# Patient Record
Sex: Female | Born: 1985 | Race: Black or African American | Hispanic: No | Marital: Single | Smoking: Never smoker
Health system: Southern US, Community
[De-identification: ages and names within clinical notes are randomized; demographics above are authoritative.]

## PROBLEM LIST (undated history)

## (undated) DIAGNOSIS — R1900 Intra-abdominal and pelvic swelling, mass and lump, unspecified site: Secondary | ICD-10-CM

## (undated) HISTORY — PX: WISDOM TOOTH EXTRACTION: SHX21

## (undated) HISTORY — DX: Intra-abdominal and pelvic swelling, mass and lump, unspecified site: R19.00

---

## 2006-04-09 ENCOUNTER — Ambulatory Visit: Payer: Self-pay | Admitting: Vascular Surgery

## 2009-03-23 ENCOUNTER — Encounter: Admission: RE | Admit: 2009-03-23 | Discharge: 2009-03-23 | Payer: Self-pay | Admitting: Ophthalmology

## 2009-05-16 ENCOUNTER — Encounter: Admission: RE | Admit: 2009-05-16 | Discharge: 2009-05-16 | Payer: Self-pay | Admitting: Diagnostic Neuroimaging

## 2009-07-01 ENCOUNTER — Encounter: Admission: RE | Admit: 2009-07-01 | Discharge: 2009-07-01 | Payer: Self-pay | Admitting: Diagnostic Neuroimaging

## 2009-09-13 ENCOUNTER — Encounter: Admission: RE | Admit: 2009-09-13 | Discharge: 2009-09-13 | Payer: Self-pay | Admitting: Diagnostic Neuroimaging

## 2009-10-25 ENCOUNTER — Encounter
Admission: RE | Admit: 2009-10-25 | Discharge: 2009-10-25 | Payer: Self-pay | Source: Home / Self Care | Attending: Diagnostic Neuroimaging | Admitting: Diagnostic Neuroimaging

## 2013-10-23 ENCOUNTER — Other Ambulatory Visit: Payer: Self-pay | Admitting: Family Medicine

## 2013-10-23 DIAGNOSIS — R1900 Intra-abdominal and pelvic swelling, mass and lump, unspecified site: Secondary | ICD-10-CM

## 2013-10-30 ENCOUNTER — Ambulatory Visit
Admission: RE | Admit: 2013-10-30 | Discharge: 2013-10-30 | Disposition: A | Discharge status: Home / Self Care | Payer: No Typology Code available for payment source | Source: Ambulatory Visit | Attending: Family Medicine | Admitting: Family Medicine

## 2013-10-30 DIAGNOSIS — R1900 Intra-abdominal and pelvic swelling, mass and lump, unspecified site: Secondary | ICD-10-CM

## 2013-11-19 ENCOUNTER — Other Ambulatory Visit: Payer: Self-pay | Admitting: *Deleted

## 2013-11-19 ENCOUNTER — Telehealth: Payer: Self-pay | Admitting: *Deleted

## 2013-11-19 DIAGNOSIS — R1900 Intra-abdominal and pelvic swelling, mass and lump, unspecified site: Secondary | ICD-10-CM

## 2013-11-19 NOTE — Telephone Encounter (Signed)
Attempted to contact patient on home phone number, no answer, left message for patient to call and request Orvella Digiulio/Kelly,. Contacted mother and informed her we need to speak with Kayla Petersen today, can she get a message to her today to call the clinic and request Orel Cooler/Kelly. Mother verbalizes understanding and will attempt to get a message to her today.

## 2013-11-19 NOTE — Telephone Encounter (Signed)
Contacted patient to discuss referral and application for charity care. Pt desires for us to contact her mother, Clifton JamesDoris Human to discuss the issue.  She gives permission for us to speak with her mother. Informed patient of referral and need for additional testing, MRI. Informed of cost of test and need to apply for charity care if she has no insurance. Informed of the documentation that we would need to process application and patient needs to come to the clinic to see Tresa EndoKelly, so Tresa EndoKelly can rush application.  Contacted patient's mother and discussed the above.  Pt mother will come today and pick up the application.

## 2013-12-03 ENCOUNTER — Ambulatory Visit (HOSPITAL_COMMUNITY)
Admission: RE | Admit: 2013-12-03 | Discharge: 2013-12-03 | Disposition: A | Discharge status: Home / Self Care | Payer: Self-pay | Source: Ambulatory Visit | Attending: Obstetrics & Gynecology | Admitting: Obstetrics & Gynecology

## 2013-12-03 ENCOUNTER — Encounter (HOSPITAL_COMMUNITY): Payer: Self-pay

## 2013-12-03 DIAGNOSIS — R102 Pelvic and perineal pain: Secondary | ICD-10-CM | POA: Insufficient documentation

## 2013-12-03 DIAGNOSIS — K6389 Other specified diseases of intestine: Secondary | ICD-10-CM | POA: Insufficient documentation

## 2013-12-03 DIAGNOSIS — R1900 Intra-abdominal and pelvic swelling, mass and lump, unspecified site: Secondary | ICD-10-CM

## 2013-12-03 DIAGNOSIS — N8320 Unspecified ovarian cysts: Secondary | ICD-10-CM | POA: Insufficient documentation

## 2013-12-03 MED ORDER — GADOBENATE DIMEGLUMINE 529 MG/ML IV SOLN
12.0000 mL | Freq: Once | INTRAVENOUS | Status: AC | PRN
  Administered 2013-12-03: 12 mL via INTRAVENOUS

## 2013-12-07 ENCOUNTER — Telehealth: Payer: Self-pay | Admitting: General Practice

## 2013-12-07 NOTE — Telephone Encounter (Signed)
Called patient, no answer- left message that we are calling with some non urgent results, please call us back at the clinics. Patient needs to be informed of ultrasound results and that [facility] refer her to a GI dr not GYN.

## 2013-12-07 NOTE — Telephone Encounter (Signed)
-----   Message from Tereso NewcomerUgonna A Anyanwu, MD sent at 12/07/2013  7:48 AM EST ----- Pelvic mass is distended large bowel and rectum; GYN organs are okay (2 cm cyst is physiologic).  Needs GI referral not GYN.  Please call to inform patient of results and recommendations.

## 2013-12-08 NOTE — Telephone Encounter (Signed)
Called patient, no answer- left message stating we are trying to reach you with non urgent results, please call us back at the clinics and let us know if information can be left on your voicemail.

## 2013-12-10 NOTE — Telephone Encounter (Signed)
Called patient and informed of recommendations, pt verbalizes understanding.  Pt to inquire about if [facility] provider can see results and notify us if we need to send records.

## 2014-01-13 ENCOUNTER — Encounter: Payer: Self-pay | Admitting: Obstetrics & Gynecology

## 2014-01-13 ENCOUNTER — Ambulatory Visit (INDEPENDENT_AMBULATORY_CARE_PROVIDER_SITE_OTHER): Payer: Self-pay | Admitting: Obstetrics & Gynecology

## 2014-01-13 VITALS — BP 131/77 | HR 91 | Temp 97.5°F | Resp 20 | Ht 70.0 in | Wt 147.7 lb

## 2014-01-13 DIAGNOSIS — R197 Diarrhea, unspecified: Secondary | ICD-10-CM

## 2014-01-13 DIAGNOSIS — R1900 Intra-abdominal and pelvic swelling, mass and lump, unspecified site: Secondary | ICD-10-CM

## 2014-01-13 NOTE — Progress Notes (Signed)
Discussed w/pt that her recent MRI shows that she does not have a pelvic mass. Per chart review, pt was contacted about this on 12/10/13 by our office personnel Blanchard Kelchndace Haizlip, RN.  Pt is confused and states that she has been given conflicting information. She reports that Dr. Read DriversMolpus told her that she was sure she had a pelvic mass. I asked if that was before the MRI and pt stated, "Yes".  She apparently had appt w/[facility] but cancelled her follow up appt on 1/11 because "they didn't follow up the way they should have."  Pt desires to speak w/Dr. Marice Potterove and her mother os present also.

## 2014-01-27 ENCOUNTER — Encounter: Payer: Self-pay | Admitting: Obstetrics & Gynecology

## 2014-01-27 NOTE — Progress Notes (Signed)
   Subjective:    Patient ID: Kayla SkinnerYolanda F Kyllonen, female    DOB: April 09, 1985, 29 y.o.   MRN: [id number]  HPI  She is here for follow up a pelvic mass. A follow up MRI showed that her distal colon/rectosigmoid is full of stool.  Review of Systems     Objective:   Physical Exam        Assessment & Plan:  She has an appt with a GI for that issue

## 2015-09-01 IMAGING — US US PELVIS COMPLETE
1 series · 13 of 25 positions shown · non-contrast
Comparison: None

CLINICAL DATA: Palpable pelvic mass



[Series 1: us pelvis complete · 0.40mm/px · 13 of 28 slices shown]
[im 1/28]
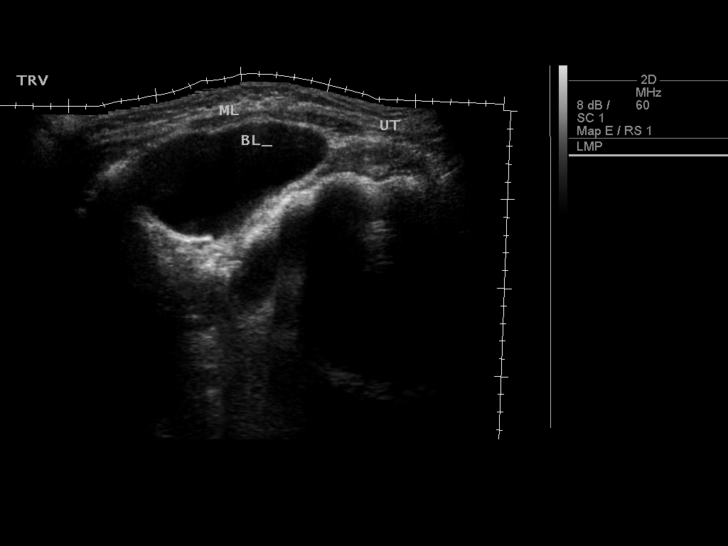
[im 3/28]
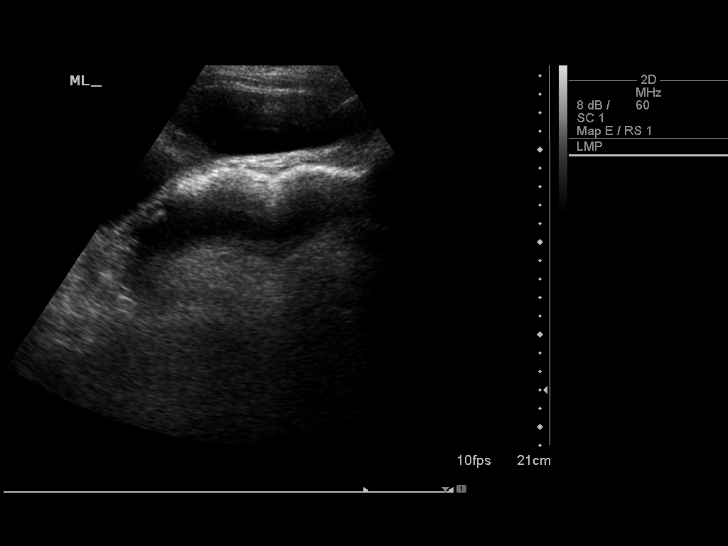
[im 5/28]
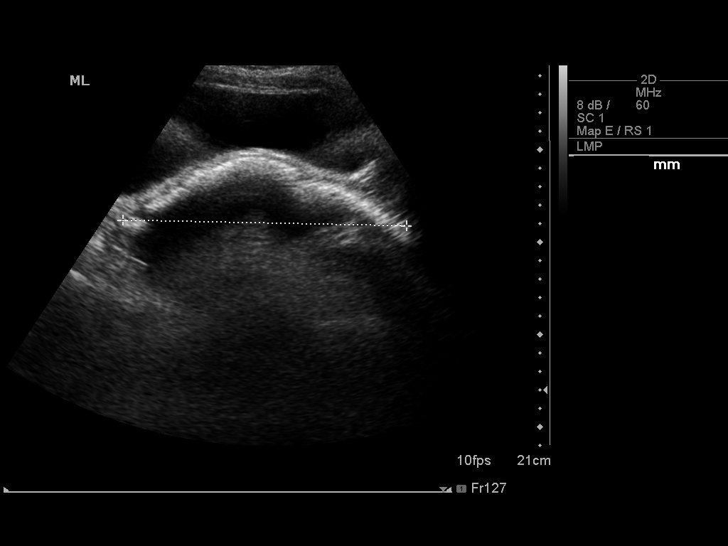
[im 7/28]
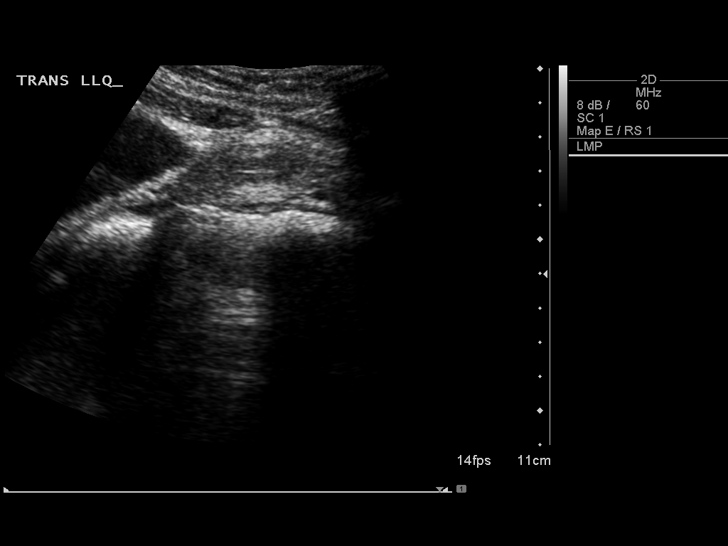
[im 10/28]
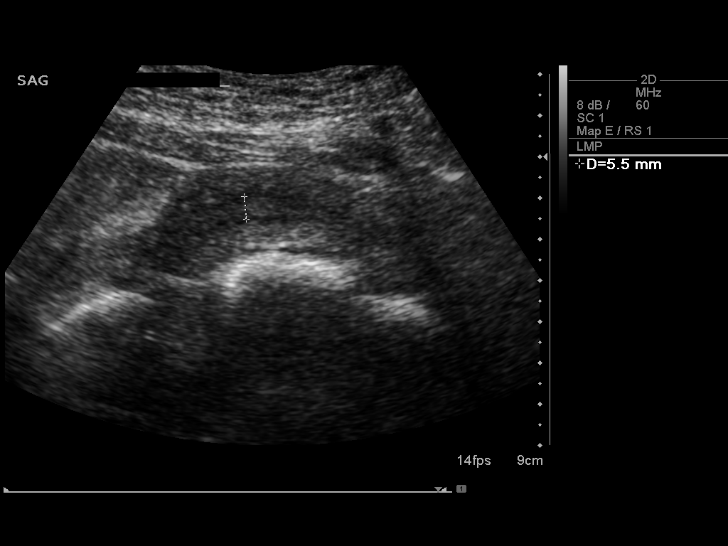
[im 12/28]
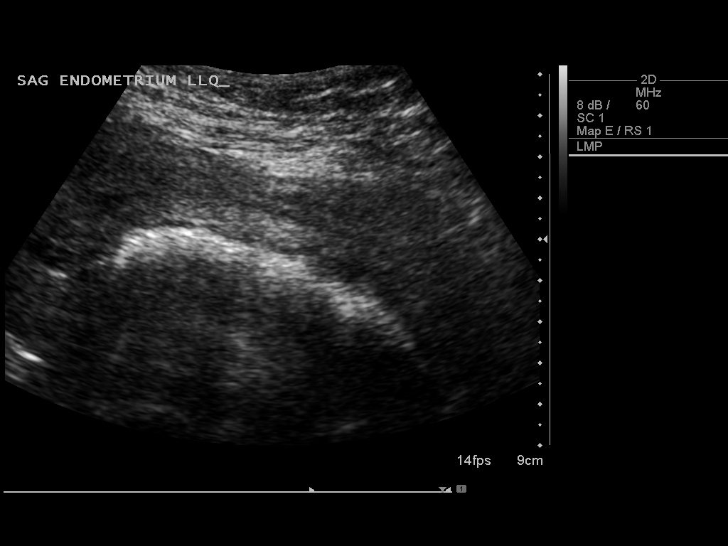
[im 14/28]
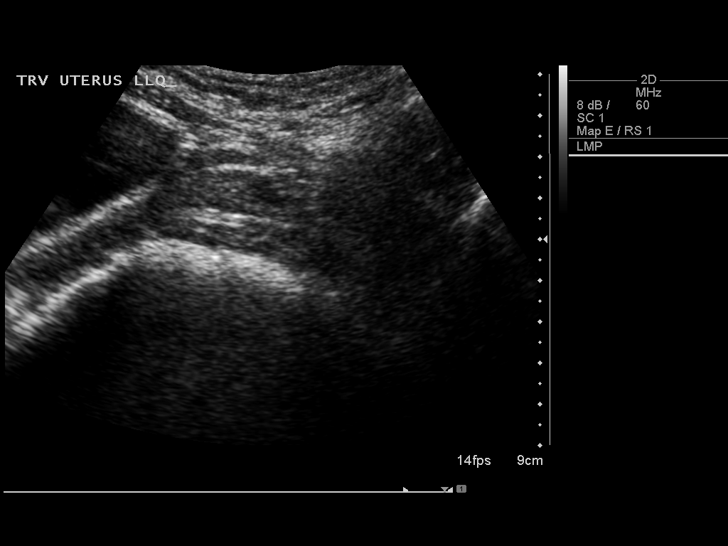
[im 16/28]
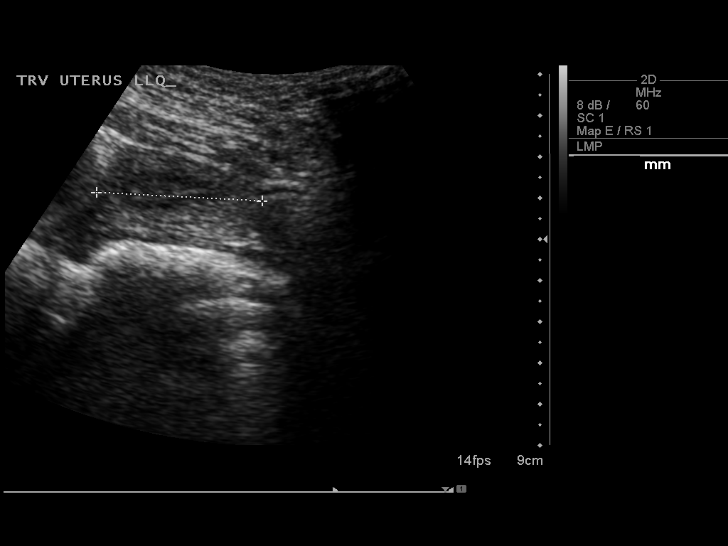
[im 19/28]
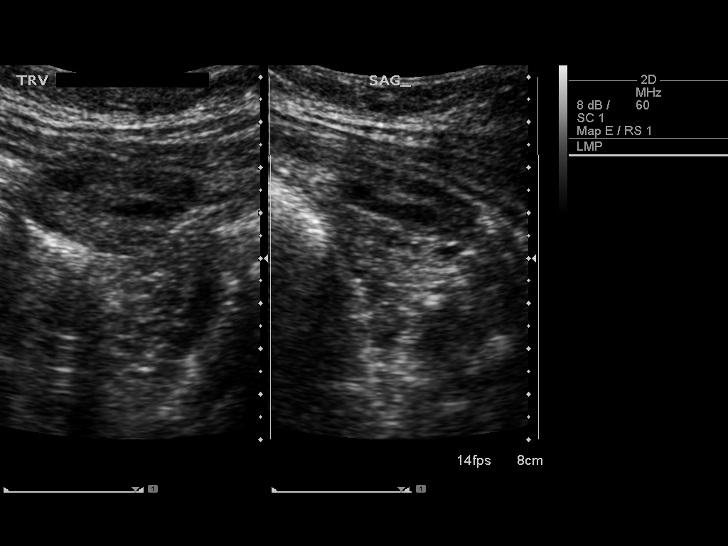
[im 21/28]
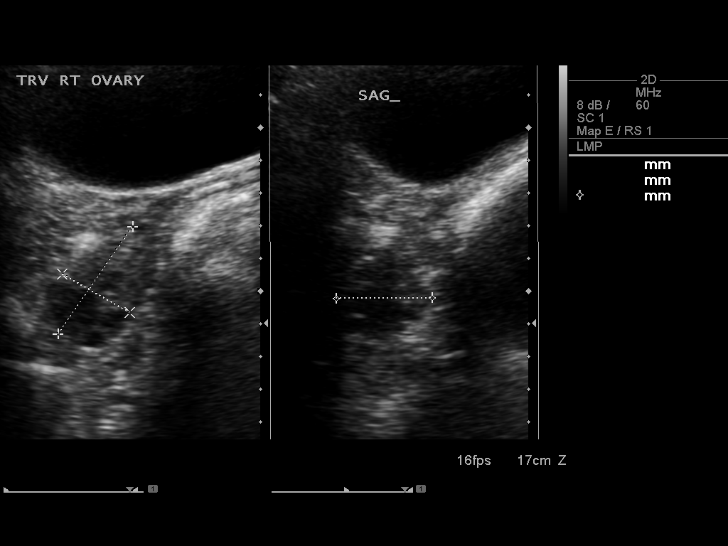
[im 23/28]
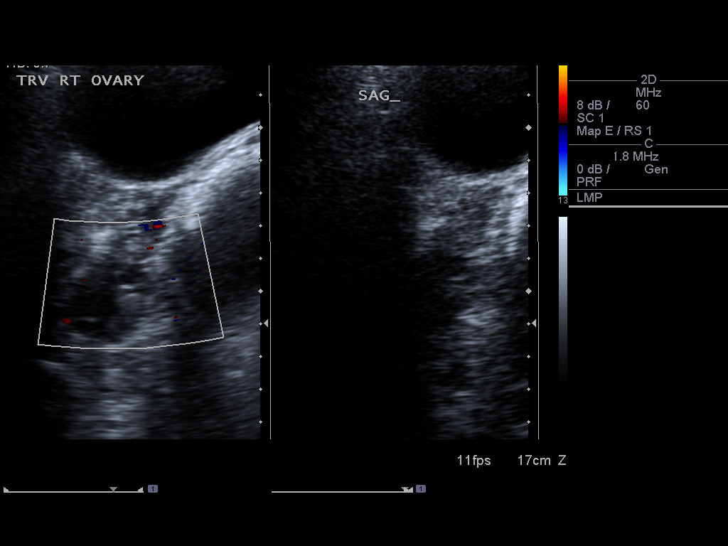
[im 25/28]
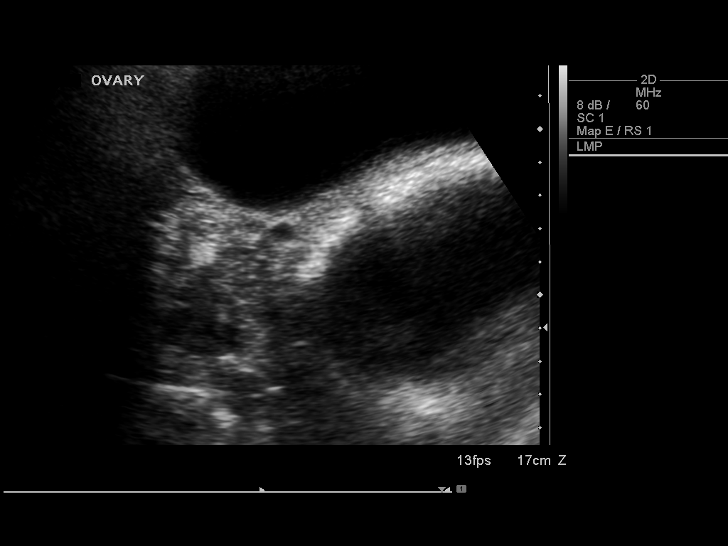
[im 28/28]
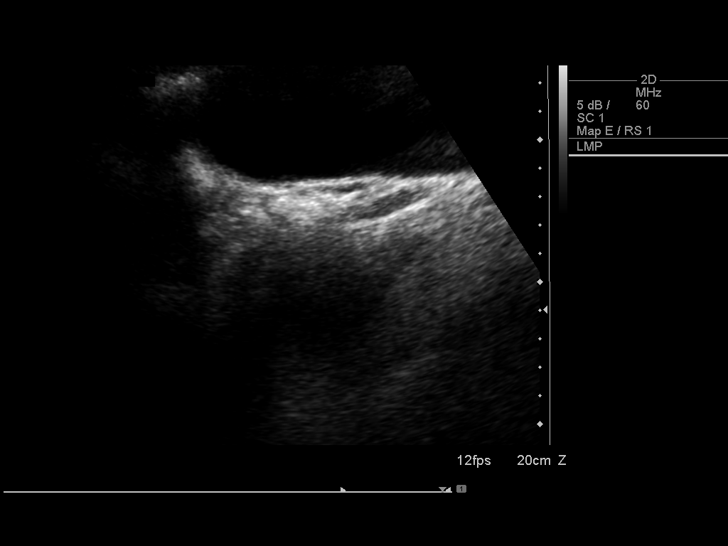

[13 of 25 positions shown; findings below may reference images not displayed]

FINDINGS: Uterus

Measurements: 12.7 x 3.1 x 4.0 cm.. No fibroids or other mass
visualized.

Endometrium

Thickness: 5.5 mm..  No focal abnormality visualized.

Right ovary

Measurements: 4.0 x 2.4 x 2.9 cm.. Normal appearance/no adnexal
mass.

Left ovary

Measurements: 4.0 x 2.2 x 3.6 cm.. Normal appearance/no adnexal
mass.

Other findings

No free fluid is noted. There is a large peripherally
echogenic/calcified mass lesion arising in the pelvis which
displaces the uterus, ovaries and urinary bladder. This does not
appear to arise from the uterus but could represent a large
exophytic fibroid.
IMPRESSION: Large peripherally echogenic mass lesion arising from the pelvis.
Its etiology is uncertain and MRI is recommended for further
characterization.

These results will be called to the ordering clinician or
representative by the Radiologist Assistant, and communication
documented in the PACS or zVision Dashboard.

## 2021-06-25 ENCOUNTER — Other Ambulatory Visit: Payer: Self-pay

## 2021-06-25 ENCOUNTER — Emergency Department (HOSPITAL_COMMUNITY)
Admission: EM | Admit: 2021-06-25 | Discharge: 2021-06-25 | Disposition: A | Discharge status: Home / Self Care | Payer: BC Managed Care – PPO | Attending: Emergency Medicine | Admitting: Emergency Medicine

## 2021-06-25 ENCOUNTER — Encounter (HOSPITAL_COMMUNITY): Payer: Self-pay | Admitting: Pharmacy Technician

## 2021-06-25 DIAGNOSIS — E876 Hypokalemia: Secondary | ICD-10-CM | POA: Insufficient documentation

## 2021-06-25 DIAGNOSIS — R110 Nausea: Secondary | ICD-10-CM | POA: Insufficient documentation

## 2021-06-25 DIAGNOSIS — K5900 Constipation, unspecified: Secondary | ICD-10-CM | POA: Insufficient documentation

## 2021-06-25 LAB — COMPREHENSIVE METABOLIC PANEL
ALT: 13 U/L (ref 0–44)
AST: 21 U/L (ref 15–41)
Albumin: 4.3 g/dL (ref 3.5–5.0)
Alkaline Phosphatase: 31 U/L — ABNORMAL LOW (ref 38–126)
Anion gap: 11 (ref 5–15)
BUN: 11 mg/dL (ref 6–20)
CO2: 24 mmol/L (ref 22–32)
Calcium: 9.9 mg/dL (ref 8.9–10.3)
Chloride: 105 mmol/L (ref 98–111)
Creatinine, Ser: 0.81 mg/dL (ref 0.44–1.00)
GFR, Estimated: >60 mL/min (ref >60)
Glucose, Bld: 111 mg/dL — ABNORMAL HIGH (ref 70–99)
Potassium: 3.2 mmol/L — ABNORMAL LOW (ref 3.5–5.1)
Sodium: 140 mmol/L (ref 135–145)
Total Bilirubin: 0.8 mg/dL (ref 0.3–1.2)
Total Protein: 7.6 g/dL (ref 6.5–8.1)

## 2021-06-25 LAB — I-STAT BETA HCG BLOOD, ED (MC, WL, AP ONLY): I-stat hCG, quantitative: <5 m[IU]/mL (ref <5)

## 2021-06-25 LAB — LIPASE, BLOOD: Lipase: 40 U/L (ref 11–51)

## 2021-06-25 LAB — CBC
HCT: 38.9 % (ref 36.0–46.0)
Hemoglobin: 13.1 g/dL (ref 12.0–15.0)
MCH: 27.9 pg (ref 26.0–34.0)
MCHC: 33.7 g/dL (ref 30.0–36.0)
MCV: 82.9 fL (ref 80.0–100.0)
Platelets: 311 10*3/uL (ref 150–400)
RBC: 4.69 MIL/uL (ref 3.87–5.11)
RDW: 14.6 % (ref 11.5–15.5)
WBC: 5.5 10*3/uL (ref 4.0–10.5)
nRBC: 0 % (ref 0.0–0.2)

## 2021-06-25 MED ORDER — BISACODYL 5 MG PO TBEC: 10.0000 mg | DELAYED_RELEASE_TABLET | Freq: Every day | ORAL | 0 refills | Status: DC | PRN

## 2021-06-25 MED ORDER — ONDANSETRON 4 MG PO TBDP: 4.0000 mg | ORAL_TABLET | Freq: Three times a day (TID) | ORAL | 0 refills | Status: AC | PRN

## 2021-06-25 MED ORDER — POLYETHYLENE GLYCOL 3350 17 GM/SCOOP PO POWD: 1.0000 | Freq: Once | ORAL | 0 refills | Status: AC

## 2021-06-25 MED ORDER — GLYCERIN (ADULT) 2 G RE SUPP: 1.0000 | RECTAL | 0 refills | Status: AC | PRN

## 2021-06-25 NOTE — ED Triage Notes (Addendum)
Pt here with constipation for "a while". Hx of same. Endorses nausea and vomiting.

## 2022-01-26 ENCOUNTER — Ambulatory Visit
Admission: EM | Admit: 2022-01-26 | Discharge: 2022-01-26 | Disposition: A | Discharge status: Home / Self Care | Payer: Medicaid Other | Attending: Nurse Practitioner | Admitting: Nurse Practitioner

## 2022-01-26 ENCOUNTER — Ambulatory Visit (INDEPENDENT_AMBULATORY_CARE_PROVIDER_SITE_OTHER): Payer: BC Managed Care – PPO

## 2022-01-26 DIAGNOSIS — K5900 Constipation, unspecified: Secondary | ICD-10-CM

## 2022-01-26 DIAGNOSIS — K5909 Other constipation: Secondary | ICD-10-CM

## 2022-01-26 MED ORDER — POLYETHYLENE GLYCOL 3350 17 G PO PACK: 17.0000 g | PACK | Freq: Once | ORAL | 0 refills | Status: AC

## 2022-01-26 MED ORDER — BISACODYL 5 MG PO TBEC: 10.0000 mg | DELAYED_RELEASE_TABLET | Freq: Every day | ORAL | 0 refills | Status: DC | PRN

## 2022-01-26 NOTE — Discharge Instructions (Addendum)
Declines once daily as needed 1 dose of MiraLAX Increase your fiber intake and stay hydrated Please go to the emergency room if any of your symptoms worsen Follow-up with your PCP for further chronic constipation management options

## 2022-01-26 NOTE — ED Provider Notes (Signed)
[facility]    CSN: [id number] Arrival date & time: 01/26/22  1801      History   Chief Complaint Chief Complaint  Patient presents with   Constipation    HPI Kayla Petersen is a 37 y.o. female presents for evaluation of constipation.  Patient reports a history of chronic constipation.  She states over the past 2 days she has noticed a fullness which is typically her first sign that she is constipated.  She denies any abdominal pain, nausea/vomiting, bloating.  She is passing gas.  She does not remember when her last bowel movement was.  She does admit she does not eat a lot of fiber but is trying to increase that.  She was last seen in June 2023 for constipation.  She had an x-ray of her abdomen which showed moderate stool as well as a stool ball and was sent to the ER for possible disimpaction.  And seen in the ER she was prescribed laxatives and sent home and states that worked.  she does not take any medications over-the-counter to help prevent her constipation.  No other concerns at this time.   Constipation   Past Medical History:  Diagnosis Date   Pelvic mass     There are no problems to display for this patient.   Past Surgical History:  Procedure Laterality Date   WISDOM TOOTH EXTRACTION      OB History     Gravida  0   Para  0   Term  0   Preterm  0   AB  0   Living  0      SAB  0   IAB  0   Ectopic  0   Multiple  0   Live Births               Home Medications    Prior to Admission medications   Medication Sig Start Date End Date Taking? Authorizing Provider  bisacodyl (DULCOLAX) 5 MG EC tablet Take 2 tablets (10 mg total) by mouth daily as needed for moderate constipation. 01/26/22  Yes Melynda Ripple, NP  polyethylene glycol (MIRALAX / GLYCOLAX) 17 g packet Take 17 g by mouth once for 1 dose. 01/26/22 01/26/22 Yes Melynda Ripple, NP  glycerin adult 2 g suppository Place 1 suppository rectally as needed for constipation.  06/25/21   Henderly, Britni A, PA-C  ondansetron (ZOFRAN-ODT) 4 MG disintegrating tablet Take 1 tablet (4 mg total) by mouth every 8 (eight) hours as needed for nausea or vomiting. 06/25/21   Henderly, Britni A, PA-C    Family History Family History  Problem Relation Age of Onset   Hypertension Mother     Social History Social History   Tobacco Use   Smoking status: Never  Substance Use Topics   Alcohol use: No   Drug use: No     Allergies   Patient has no known allergies.   Review of Systems Review of Systems  Gastrointestinal:  Positive for constipation.     Physical Exam Triage Vital Signs ED Triage Vitals  Enc Vitals Group     BP 01/26/22 1819 127/87     Pulse Rate 01/26/22 1819 100     Resp 01/26/22 1819 16     Temp 01/26/22 1819 (!) 97.5 F (36.4 C)     Temp Source 01/26/22 1819 Oral     SpO2 01/26/22 1819 98 %     Weight --  Height --      Head Circumference --      Peak Flow --      Pain Score 01/26/22 1833 0     Pain Loc --      Pain Edu? --      Excl. in [place]? --    No data found.  Updated Vital Signs BP 127/87 (BP Location: Left Arm)   Pulse 100   Temp (!) 97.5 F (36.4 C) (Oral)   Resp 16   LMP 01/17/2022   SpO2 98%   Visual Acuity Right Eye Distance:   Left Eye Distance:   Bilateral Distance:    Right Eye Near:   Left Eye Near:    Bilateral Near:     Physical Exam Vitals and nursing note reviewed.  Constitutional:      Appearance: Normal appearance.  HENT:     Head: Normocephalic and atraumatic.  Eyes:     Pupils: Pupils are equal, round, and reactive to light.  Cardiovascular:     Rate and Rhythm: Normal rate.  Pulmonary:     Effort: Pulmonary effort is normal.  Abdominal:     General: Bowel sounds are normal.     Tenderness: There is no abdominal tenderness. There is no right CVA tenderness or left CVA tenderness.     Comments: Abdomen is nontender but firm  Skin:    General: Skin is warm and dry.  Neurological:      General: No focal deficit present.     Mental Status: She is alert and oriented to person, place, and time.  Psychiatric:        Mood and Affect: Mood normal.        Behavior: Behavior normal.      UC Treatments / Results  Labs (all labs ordered are listed, but only abnormal results are displayed) Labs Reviewed - No data to display  EKG   Radiology DG Abd 1 View  Result Date: 01/26/2022 CLINICAL DATA:  Constipation. EXAM: ABDOMEN - 1 VIEW COMPARISON:  None Available. FINDINGS: No abnormal bowel dilatation is noted. Large amount of stool seen throughout the colon rectum. No radio-opaque calculi or other significant radiographic abnormality are seen. IMPRESSION: Large stool burden.  No abnormal bowel dilatation. Electronically Signed   By: Marijo Conception M.D.   On: 01/26/2022 19:32    Procedures Procedures (including critical care time)  Medications Ordered in UC Medications - No data to display  Initial Impression / Assessment and Plan / UC Course  I have reviewed the triage vital signs and the nursing notes.  Pertinent labs & imaging results that were available during my care of the patient were reviewed by me and considered in my medical decision making (see chart for details).     Reviewed exam and x-ray with patient MiraLAX and declog sent to pharmacy.  This has worked for patient in the past Discussed fluids and increasing fiber Advised follow-up with PCP for further management of her chronic constipation ER precautions reviewed and patient verbalized understanding Final Clinical Impressions(s) / UC Diagnoses   Final diagnoses:  Chronic constipation     Discharge Instructions      Declines once daily as needed 1 dose of MiraLAX Increase your fiber intake and stay hydrated Please go to the emergency room if any of your symptoms worsen Follow-up with your PCP for further chronic constipation management options     ED Prescriptions     Medication  Sig Dispense Auth. Provider  bisacodyl (DULCOLAX) 5 MG EC tablet Take 2 tablets (10 mg total) by mouth daily as needed for moderate constipation. 5 tablet Radford Pax, NP   polyethylene glycol (MIRALAX / GLYCOLAX) 17 g packet Take 17 g by mouth once for 1 dose. 1 each Radford Pax, NP      PDMP not reviewed this encounter.   Radford Pax, NP 01/26/22 [phone]

## 2022-01-26 NOTE — ED Triage Notes (Signed)
Pt c/o constipation that began Wednesday.  Home interventions: none

## 2022-02-25 ENCOUNTER — Ambulatory Visit
Admission: EM | Admit: 2022-02-25 | Discharge: 2022-02-25 | Disposition: A | Discharge status: Home / Self Care | Payer: Medicaid Other | Attending: Nurse Practitioner | Admitting: Nurse Practitioner

## 2022-02-25 DIAGNOSIS — H01119 Allergic dermatitis of unspecified eye, unspecified eyelid: Secondary | ICD-10-CM

## 2022-02-25 MED ORDER — HYDROCORTISONE 1 % EX CREA: TOPICAL_CREAM | CUTANEOUS | 0 refills | Status: AC

## 2022-02-25 NOTE — Discharge Instructions (Signed)
Over-the-counter Aquaphor lotion to your eyelids for hydration Hydrocortisone cream sparingly no more than twice a day and no more than 5 days to your upper eyelids.  Avoid getting this into your eyes Start an allergy medication over-the-counter such as Claritin or Zyrtec daily for at least 7 days Follow-up if your symptoms or not improving Please go to the ER for any worsening symptoms

## 2022-02-25 NOTE — ED Provider Notes (Signed)
[facility]    CSN: [id number] Arrival date & time: 02/25/22  1527      History   Chief Complaint Chief Complaint  Patient presents with   Rash    HPI Kayla Petersen is a 37 y.o. female presents for evaluation of a rash.  Patient reports 2 weeks of a pruritic flaking dry rash on her upper eyelids bilaterally.  Denies any drainage, fevers, chills.  Denies any new contacts including soaps, medications, make-up, etc.  Denies history of eczema but that does run in her family.  She has not used any OTC medications.  No other concerns at this time.   Rash   Past Medical History:  Diagnosis Date   Pelvic mass     There are no problems to display for this patient.   Past Surgical History:  Procedure Laterality Date   WISDOM TOOTH EXTRACTION      OB History     Gravida  0   Para  0   Term  0   Preterm  0   AB  0   Living  0      SAB  0   IAB  0   Ectopic  0   Multiple  0   Live Births               Home Medications    Prior to Admission medications   Medication Sig Start Date End Date Taking? Authorizing Provider  hydrocortisone cream 1 % Apply to affected area 2 times daily.  Use sparingly and no more than 5 days.  Do not get into the eye. 02/25/22  Yes Melynda Ripple, NP  bisacodyl (DULCOLAX) 5 MG EC tablet Take 2 tablets (10 mg total) by mouth daily as needed for moderate constipation. 01/26/22   Melynda Ripple, NP  glycerin adult 2 g suppository Place 1 suppository rectally as needed for constipation. 06/25/21   Henderly, Britni A, PA-C  ondansetron (ZOFRAN-ODT) 4 MG disintegrating tablet Take 1 tablet (4 mg total) by mouth every 8 (eight) hours as needed for nausea or vomiting. 06/25/21   Henderly, Britni A, PA-C    Family History Family History  Problem Relation Age of Onset   Hypertension Mother     Social History Social History   Tobacco Use   Smoking status: Never  Substance Use Topics   Alcohol use: No   Drug use: No      Allergies   Patient has no known allergies.   Review of Systems Review of Systems  Skin:  Positive for rash.     Physical Exam Triage Vital Signs ED Triage Vitals  Enc Vitals Group     BP 02/25/22 1558 114/77     Pulse Rate 02/25/22 1558 73     Resp 02/25/22 1558 18     Temp 02/25/22 1558 98.6 F (37 C)     Temp Source 02/25/22 1558 Oral     SpO2 02/25/22 1558 98 %     Weight --      Height --      Head Circumference --      Peak Flow --      Pain Score 02/25/22 1557 0     Pain Loc --      Pain Edu? --      Excl. in [place]? --    No data found.  Updated Vital Signs BP 114/77 (BP Location: Left Arm)   Pulse 73   Temp  98.6 F (37 C) (Oral)   Resp 18   LMP 02/19/2022   SpO2 98%   Visual Acuity Right Eye Distance:   Left Eye Distance:   Bilateral Distance:    Right Eye Near:   Left Eye Near:    Bilateral Near:     Physical Exam Vitals and nursing note reviewed.  Constitutional:      Appearance: Normal appearance.  HENT:     Head: Normocephalic and atraumatic.  Eyes:     Pupils: Pupils are equal, round, and reactive to light.     Comments: Dry nonerythematous rash to upper eyelids bilaterally.  No drainage, swelling, warmth.  Cardiovascular:     Rate and Rhythm: Normal rate.  Pulmonary:     Effort: Pulmonary effort is normal.  Skin:    General: Skin is warm and dry.  Neurological:     General: No focal deficit present.     Mental Status: She is alert and oriented to person, place, and time.  Psychiatric:        Mood and Affect: Mood normal.        Behavior: Behavior normal.      UC Treatments / Results  Labs (all labs ordered are listed, but only abnormal results are displayed) Labs Reviewed - No data to display  EKG   Radiology No results found.  Procedures Procedures (including critical care time)  Medications Ordered in UC Medications - No data to display  Initial Impression / Assessment and Plan / UC Course  I have  reviewed the triage vital signs and the nursing notes.  Pertinent labs & imaging results that were available during my care of the patient were reviewed by me and considered in my medical decision making (see chart for details).     Discussed with patient symptoms consistent with eyelid dermatitis OTC Aquaphor to the affected areas Hydrocortisone cream sparingly for no more than 5 days to affected areas.  Instructed not to get into eyes Also advised allergy medicine OTC daily for at least 7 days Follow-up if symptoms or not improving ER precautions reviewed and patient verbalized understanding Final Clinical Impressions(s) / UC Diagnoses   Final diagnoses:  Eyelid dermatitis, allergic/contact, unspecified laterality     Discharge Instructions      Over-the-counter Aquaphor lotion to your eyelids for hydration Hydrocortisone cream sparingly no more than twice a day and no more than 5 days to your upper eyelids.  Avoid getting this into your eyes Start an allergy medication over-the-counter such as Claritin or Zyrtec daily for at least 7 days Follow-up if your symptoms or not improving Please go to the ER for any worsening symptoms   ED Prescriptions     Medication Sig Dispense Auth. Provider   hydrocortisone cream 1 % Apply to affected area 2 times daily.  Use sparingly and no more than 5 days.  Do not get into the eye. 15 g Melynda Ripple, NP      PDMP not reviewed this encounter.   Melynda Ripple, NP 02/25/22 1626

## 2022-02-25 NOTE — ED Triage Notes (Signed)
Pt reports c/o a rash x 2 weeks.   States she has not taken anything for relief.

## 2022-05-11 ENCOUNTER — Ambulatory Visit
Admission: EM | Admit: 2022-05-11 | Discharge: 2022-05-11 | Disposition: A | Discharge status: Home / Self Care | Payer: BC Managed Care – PPO | Attending: Urgent Care | Admitting: Urgent Care

## 2022-05-11 DIAGNOSIS — B349 Viral infection, unspecified: Secondary | ICD-10-CM

## 2022-05-11 MED ORDER — PSEUDOEPHEDRINE HCL 30 MG PO TABS: 30.0000 mg | ORAL_TABLET | Freq: Three times a day (TID) | ORAL | 0 refills | Status: AC | PRN

## 2022-05-11 MED ORDER — BENZONATATE 100 MG PO CAPS: 100.0000 mg | ORAL_CAPSULE | Freq: Three times a day (TID) | ORAL | 0 refills | Status: AC | PRN

## 2022-05-11 MED ORDER — PROMETHAZINE-DM 6.25-15 MG/5ML PO SYRP: 5.0000 mL | ORAL_SOLUTION | Freq: Three times a day (TID) | ORAL | 0 refills | Status: AC | PRN

## 2022-05-11 MED ORDER — CETIRIZINE HCL 10 MG PO TABS: 10.0000 mg | ORAL_TABLET | Freq: Every day | ORAL | 0 refills | Status: AC

## 2022-05-11 NOTE — Discharge Instructions (Signed)
We will manage this as a viral illness. For sore throat or cough try using a honey-based tea. Use 3 teaspoons of honey with juice squeezed from half lemon. Place shaved pieces of ginger into 1/2-1 cup of water and warm over stove top. Then mix the ingredients and repeat every 4 hours as needed. Please take ibuprofen 600mg  every 6 hours with food alternating with OR taken together with Tylenol 500mg -650mg  every 6 hours for throat pain, fevers, aches and pains. Hydrate very well with at least 2 liters of water. Eat light meals such as soups (chicken and noodles, vegetable, chicken and wild rice).  Do not eat foods that you are allergic to.  Taking an antihistamine like Zyrtec (10mg  daily) can help against postnasal drainage, sinus congestion which can cause sinus pain, sinus headaches, throat pain, painful swallowing, coughing.  You can take this together with pseudoephedrine (Sudafed) at a dose of 30mg  3 times a day or twice daily as needed for the same kind of nasal drip, congestion.  You can use this together with cough medications as needed.

## 2022-05-11 NOTE — ED Triage Notes (Signed)
Pt c/o fever, body aches, cough x 4 days-NAD-steady gait

## 2022-05-11 NOTE — ED Provider Notes (Signed)
[facility]  Note:  This document was prepared using Conservation officer, historic buildings and may include unintentional dictation errors.  MRN: [id number] DOB: 11-02-1985  Subjective:   Kayla Petersen is a 37 y.o. female presenting for 4 day history of malaise, fatigue, body aches, fever. No throat pain, sinus pain, runny or stuffy nose, ear pain, chest pain, shob, wheezing, rashes. No smoking of any kind including cigarettes, cigars, vaping, marijuana use.  No allergies or asthma. No sick contacts. Does not want a COVID test.   No current facility-administered medications for this encounter.  Current Outpatient Medications:    bisacodyl (DULCOLAX) 5 MG EC tablet, Take 2 tablets (10 mg total) by mouth daily as needed for moderate constipation., Disp: 5 tablet, Rfl: 0   glycerin adult 2 g suppository, Place 1 suppository rectally as needed for constipation., Disp: 12 suppository, Rfl: 0   hydrocortisone cream 1 %, Apply to affected area 2 times daily.  Use sparingly and no more than 5 days.  Do not get into the eye., Disp: 15 g, Rfl: 0   ondansetron (ZOFRAN-ODT) 4 MG disintegrating tablet, Take 1 tablet (4 mg total) by mouth every 8 (eight) hours as needed for nausea or vomiting., Disp: 20 tablet, Rfl: 0   No Known Allergies  Past Medical History:  Diagnosis Date   Pelvic mass      Past Surgical History:  Procedure Laterality Date   WISDOM TOOTH EXTRACTION      Family History  Problem Relation Age of Onset   Hypertension Mother     Social History   Tobacco Use   Smoking status: Never  Substance Use Topics   Alcohol use: No   Drug use: No    ROS   Objective:   Vitals: BP 127/86 (BP Location: Left Arm)   Pulse 79   Temp 99 F (37.2 C) (Oral)   Resp 20   LMP 04/30/2022   SpO2 98%   Physical Exam Constitutional:      General: She is not in acute distress.    Appearance: Normal appearance. She is well-developed and normal weight. She is  not ill-appearing, toxic-appearing or diaphoretic.  HENT:     Head: Normocephalic and atraumatic.     Right Ear: Tympanic membrane, ear canal and external ear normal. No drainage or tenderness. No middle ear effusion. There is no impacted cerumen. Tympanic membrane is not erythematous or bulging.     Left Ear: Tympanic membrane, ear canal and external ear normal. No drainage or tenderness.  No middle ear effusion. There is no impacted cerumen. Tympanic membrane is not erythematous or bulging.     Nose: Nose normal. No congestion or rhinorrhea.     Mouth/Throat:     Mouth: Mucous membranes are moist. No oral lesions.     Pharynx: No pharyngeal swelling, oropharyngeal exudate, posterior oropharyngeal erythema or uvula swelling.     Tonsils: No tonsillar exudate or tonsillar abscesses.  Eyes:     General: No scleral icterus.       Right eye: No discharge.        Left eye: No discharge.     Extraocular Movements: Extraocular movements intact.     Right eye: Normal extraocular motion.     Left eye: Normal extraocular motion.     Conjunctiva/sclera: Conjunctivae normal.  Cardiovascular:     Rate and Rhythm: Normal rate and regular rhythm.     Heart sounds: Normal heart sounds. No murmur heard.  No friction rub. No gallop.  Pulmonary:     Effort: Pulmonary effort is normal. No respiratory distress.     Breath sounds: No stridor. No wheezing, rhonchi or rales.  Chest:     Chest wall: No tenderness.  Musculoskeletal:     Cervical back: Normal range of motion and neck supple.  Lymphadenopathy:     Cervical: No cervical adenopathy.  Skin:    General: Skin is warm and dry.  Neurological:     General: No focal deficit present.     Mental Status: She is alert and oriented to person, place, and time.  Psychiatric:        Mood and Affect: Mood normal.        Behavior: Behavior normal.     Assessment and Plan :   PDMP not reviewed this encounter.  1. Acute viral syndrome    Deferred  imaging given clear cardiopulmonary exam, hemodynamically stable vital signs. Suspect viral URI, viral syndrome. Physical exam findings reassuring and vital signs stable for discharge. Advised supportive care, offered symptomatic relief. Counseled patient on potential for adverse effects with medications prescribed/recommended today, ER and return-to-clinic precautions discussed, patient verbalized understanding.     Wallis Bamberg, [place] 05/11/22 1610

## 2022-06-04 ENCOUNTER — Telehealth: Payer: Self-pay | Admitting: Urgent Care

## 2022-06-04 ENCOUNTER — Ambulatory Visit
Admission: EM | Admit: 2022-06-04 | Discharge: 2022-06-04 | Disposition: A | Discharge status: Home / Self Care | Payer: Medicaid Other | Attending: Urgent Care | Admitting: Urgent Care

## 2022-06-04 DIAGNOSIS — K5909 Other constipation: Secondary | ICD-10-CM | POA: Diagnosis not present

## 2022-06-04 MED ORDER — FLEET ENEMA 7-19 GM/118ML RE ENEM: 1.0000 | ENEMA | Freq: Every day | RECTAL | 1 refills | Status: AC | PRN

## 2022-06-04 MED ORDER — DOCUSATE SODIUM 100 MG PO CAPS: 100.0000 mg | ORAL_CAPSULE | Freq: Two times a day (BID) | ORAL | 0 refills | Status: AC

## 2022-06-04 MED ORDER — POLYETHYLENE GLYCOL 3350 17 GM/SCOOP PO POWD: 17.0000 g | Freq: Every day | ORAL | 1 refills | Status: AC | PRN

## 2022-06-04 MED ORDER — BISACODYL 5 MG PO TBEC
10.0000 mg | DELAYED_RELEASE_TABLET | Freq: Every day | ORAL | 0 refills | Status: AC | PRN
Start: 2022-06-04 — End: ?

## 2022-06-04 NOTE — Discharge Instructions (Addendum)
For moderate to severe constipation (not having a bowel movement in more than 3 days) then try to use an enema or Miralax once daily until you have a good bowel movement.  It is not a good idea to use an enema or laxatives daily. If you find you are doing this, then please follow up with a gastroenterologist. Otherwise, a medication you could use daily to help with promoting bowel movements is docusate (Colace) 100mg . It is okay to use this 1-2 times daily as a stool softener.  Try to stay active physically including regular exercise 2-3 times a week.  Make sure you hydrate well every day with about 80 ounces of water daily (that is 2 liters).  Try to avoid carb heavy foods, dairy. This includes cutting out breads, pasta, pizza, pastries, potatoes, rice, starchy foods in general. Eat more fiber as listed below:  Salads - kale, spinach, cabbage, spring mix, arugula Fruits - avocadoes, berries (blueberries, raspberries, blackberries), apples, oranges, pomegranate, grapefruit, kiwi Vegetables - asparagus, cauliflower, broccoli, green beans, brussel sprouts, bell peppers, beets; stay away from or limit starchy vegetables like potatoes, carrots, peas Other general foods - kidney beans, egg whites, almonds, walnuts, sunflower seeds, pumpkin seeds, fat free yogurt, almond milk, flax seeds, quinoa, oats, farro  Meat - It is better to eat lean meats and limit your red meat including pork to once a week.  Wild caught fish, chicken breast are good options as they tend to be leaner sources of good protein. Still be mindful of the sodium labels for the meats you buy.  DO NOT EAT ANY FOODS ON THIS LIST THAT YOU ARE ALLERGIC TO. For more specific needs, I highly recommend consulting a dietician or nutritionist but this can definitely be a good starting point.

## 2022-06-04 NOTE — Telephone Encounter (Signed)
Patient called requesting Dulcolax as well for her constipation.

## 2022-06-04 NOTE — ED Provider Notes (Signed)
[facility]  Note:  This document was prepared using Conservation officer, historic buildings and may include unintentional dictation errors.  MRN: [id number] DOB: [birth year]  Subjective:   Kayla Petersen is a 37 y.o. female presenting for acute on chronic constipation.  Patient cannot recall the last time she had a bowel movement.  Has had longstanding history of the same.  Has tried multiple laxatives, suppositories.  Has had rare instances when she needed disimpaction.  She has also used an enema but it has been rare.  Patient admits that her diet is not compliant.  Has never been seen by gastroenterologist.  No bloody stools.  No fever, recent antibiotic use, hospitalizations or long distance travel.  Has not eaten raw foods, drank unfiltered water.  No history of GI disorders including Crohn's, IBS, ulcerative colitis.   No current facility-administered medications for this encounter.  Current Outpatient Medications:    benzonatate (TESSALON) 100 MG capsule, Take 1 capsule (100 mg total) by mouth 3 (three) times daily as needed for cough., Disp: 30 capsule, Rfl: 0   bisacodyl (DULCOLAX) 5 MG EC tablet, Take 2 tablets (10 mg total) by mouth daily as needed for moderate constipation., Disp: 5 tablet, Rfl: 0   cetirizine (ZYRTEC ALLERGY) 10 MG tablet, Take 1 tablet (10 mg total) by mouth daily., Disp: 30 tablet, Rfl: 0   glycerin adult 2 g suppository, Place 1 suppository rectally as needed for constipation., Disp: 12 suppository, Rfl: 0   hydrocortisone cream 1 %, Apply to affected area 2 times daily.  Use sparingly and no more than 5 days.  Do not get into the eye., Disp: 15 g, Rfl: 0   ondansetron (ZOFRAN-ODT) 4 MG disintegrating tablet, Take 1 tablet (4 mg total) by mouth every 8 (eight) hours as needed for nausea or vomiting., Disp: 20 tablet, Rfl: 0   promethazine-dextromethorphan (PROMETHAZINE-DM) 6.25-15 MG/5ML syrup, Take 5 mLs by mouth 3 (three) times daily as  needed for cough., Disp: 200 mL, Rfl: 0   pseudoephedrine (SUDAFED) 30 MG tablet, Take 1 tablet (30 mg total) by mouth every 8 (eight) hours as needed for congestion., Disp: 30 tablet, Rfl: 0   No Known Allergies  Past Medical History:  Diagnosis Date   Pelvic mass      Past Surgical History:  Procedure Laterality Date   WISDOM TOOTH EXTRACTION      Family History  Problem Relation Age of Onset   Hypertension Mother     Social History   Tobacco Use   Smoking status: Never   Smokeless tobacco: Never  Vaping Use   Vaping Use: Never used  Substance Use Topics   Alcohol use: No   Drug use: No    ROS   Objective:   Vitals: BP 129/88 (BP Location: Left Arm)   Pulse 87   Temp 98.5 F (36.9 C) (Oral)   Resp 20   LMP 05/26/2022   SpO2 97%   Physical Exam Constitutional:      General: She is not in acute distress.    Appearance: Normal appearance. She is well-developed. She is not ill-appearing, toxic-appearing or diaphoretic.  HENT:     Head: Normocephalic and atraumatic.     Nose: Nose normal.     Mouth/Throat:     Mouth: Mucous membranes are moist.  Eyes:     General: No scleral icterus.       Right eye: No discharge.        Left  eye: No discharge.     Extraocular Movements: Extraocular movements intact.     Conjunctiva/sclera: Conjunctivae normal.  Cardiovascular:     Rate and Rhythm: Normal rate and regular rhythm.     Heart sounds: Normal heart sounds. No murmur heard.    No friction rub. No gallop.  Pulmonary:     Effort: Pulmonary effort is normal. No respiratory distress.     Breath sounds: No stridor. No wheezing, rhonchi or rales.  Chest:     Chest wall: No tenderness.  Abdominal:     General: Bowel sounds are normal. There is no distension.     Palpations: Abdomen is soft. There is no mass.     Tenderness: There is no abdominal tenderness. There is no right CVA tenderness, left CVA tenderness, guarding or rebound.  Skin:    General: Skin is  warm and dry.  Neurological:     General: No focal deficit present.     Mental Status: She is alert and oriented to person, place, and time.  Psychiatric:        Mood and Affect: Mood normal.        Behavior: Behavior normal.        Thought Content: Thought content normal.        Judgment: Judgment normal.     Assessment and Plan :   PDMP not reviewed this encounter.  1. Chronic constipation    Patient has acute on chronic constipation.  Emphasized need for significant dietary modifications.  Provided with a prescription for polyethylene glycol powder.  Can also use a Fleet enema if this does not work.  Start a stool softener daily.  Recommended consultation with gastroenterology, referral was placed.  No signs of an acute abdomen, bowel obstruction.  Patient is passing gas, is not vomiting and is able to eat and drink still.  Counseled patient on potential for adverse effects with medications prescribed/recommended today, ER and return-to-clinic precautions discussed, patient verbalized understanding.    Wallis Bamberg, [place] 06/04/22 1821

## 2022-06-04 NOTE — ED Triage Notes (Signed)
Pt c/o constipation-states she can not recall last normal BM-reports multiple UC visits for same-NAD-steady gait

## 2022-12-03 ENCOUNTER — Emergency Department (HOSPITAL_BASED_OUTPATIENT_CLINIC_OR_DEPARTMENT_OTHER)
Admission: EM | Admit: 2022-12-03 | Discharge: 2022-12-03 | Disposition: A | Discharge status: Home / Self Care | Payer: Medicaid Other | Attending: Emergency Medicine | Admitting: Emergency Medicine

## 2022-12-03 ENCOUNTER — Emergency Department (HOSPITAL_BASED_OUTPATIENT_CLINIC_OR_DEPARTMENT_OTHER): Payer: Medicaid Other | Admitting: Radiology

## 2022-12-03 DIAGNOSIS — R059 Cough, unspecified: Secondary | ICD-10-CM | POA: Diagnosis present

## 2022-12-03 DIAGNOSIS — Z1152 Encounter for screening for COVID-19: Secondary | ICD-10-CM | POA: Diagnosis not present

## 2022-12-03 DIAGNOSIS — J069 Acute upper respiratory infection, unspecified: Secondary | ICD-10-CM | POA: Insufficient documentation

## 2022-12-03 LAB — CBC WITH DIFFERENTIAL/PLATELET
Abs Immature Granulocytes: 0.02 10*3/uL (ref 0.00–0.07)
Basophils Absolute: 0 10*3/uL (ref 0.0–0.1)
Basophils Relative: 1 %
Eosinophils Absolute: 0.2 10*3/uL (ref 0.0–0.5)
Eosinophils Relative: 3 %
HCT: 34.8 % — ABNORMAL LOW (ref 36.0–46.0)
Hemoglobin: 11.4 g/dL — ABNORMAL LOW (ref 12.0–15.0)
Immature Granulocytes: 0 %
Lymphocytes Relative: 22 %
Lymphs Abs: 1.5 10*3/uL (ref 0.7–4.0)
MCH: 27.9 pg (ref 26.0–34.0)
MCHC: 32.8 g/dL (ref 30.0–36.0)
MCV: 85.3 fL (ref 80.0–100.0)
Monocytes Absolute: 0.4 10*3/uL (ref 0.1–1.0)
Monocytes Relative: 6 %
Neutro Abs: 4.7 10*3/uL (ref 1.7–7.7)
Neutrophils Relative %: 68 %
Platelets: 310 10*3/uL (ref 150–400)
RBC: 4.08 MIL/uL (ref 3.87–5.11)
RDW: 14.5 % (ref 11.5–15.5)
WBC: 6.9 10*3/uL (ref 4.0–10.5)
nRBC: 0 % (ref 0.0–0.2)

## 2022-12-03 LAB — BASIC METABOLIC PANEL
Anion gap: 6 (ref 5–15)
BUN: 13 mg/dL (ref 6–20)
CO2: 28 mmol/L (ref 22–32)
Calcium: 9.8 mg/dL (ref 8.9–10.3)
Chloride: 105 mmol/L (ref 98–111)
Creatinine, Ser: 0.75 mg/dL (ref 0.44–1.00)
GFR, Estimated: >60 mL/min (ref >60)
Glucose, Bld: 100 mg/dL — ABNORMAL HIGH (ref 70–99)
Potassium: 3.8 mmol/L (ref 3.5–5.1)
Sodium: 139 mmol/L (ref 135–145)

## 2022-12-03 LAB — RESP PANEL BY RT-PCR (RSV, FLU A&B, COVID)  RVPGX2
Influenza A by PCR: NEGATIVE
Influenza B by PCR: NEGATIVE
Resp Syncytial Virus by PCR: NEGATIVE
SARS Coronavirus 2 by RT PCR: NEGATIVE

## 2022-12-03 MED ORDER — DEXAMETHASONE 4 MG PO TABS
10.0000 mg | ORAL_TABLET | Freq: Once | ORAL | Status: AC
  Administered 2022-12-03: 10 mg via ORAL
  Filled 2022-12-03: qty 3

## 2022-12-03 MED ORDER — AZITHROMYCIN 250 MG PO TABS
500.0000 mg | ORAL_TABLET | Freq: Once | ORAL | Status: AC
  Administered 2022-12-03: 500 mg via ORAL
  Filled 2022-12-03: qty 2

## 2022-12-03 MED ORDER — AZITHROMYCIN 250 MG PO TABS: 250.0000 mg | ORAL_TABLET | Freq: Every day | ORAL | 0 refills | Status: AC

## 2022-12-03 NOTE — Discharge Instructions (Signed)
Take next dose antibiotic tomorrow.  Follow-up your lead test on your MyChart.  Discussed with your primary care doctor about it as well.

## 2022-12-03 NOTE — ED Provider Notes (Signed)
[place] EMERGENCY DEPARTMENT AT [facility] Provider Note   CSN: [id number] Arrival date & time: 12/03/22  1738     History  Chief Complaint  Patient presents with   URI    Kayla Petersen is a 37 y.o. female.  Patient here with cough congestion.  Family member with the same.  She wants to also be tested for lead as may be something that she has been exposed to at work.  She is not having any chest pain or shortness of breath.  Symptoms for the last few days maybe longer.  Denies any shortness of breath.  No recent surgery or travel.  No weakness numbness tingling.  The history is provided by the patient.       Home Medications Prior to Admission medications   Medication Sig Start Date End Date Taking? Authorizing Provider  azithromycin (ZITHROMAX) 250 MG tablet Take 1 tablet (250 mg total) by mouth daily. Take first 2 tablets together, then 1 every day until finished. 12/03/22  Yes Santonio Speakman, DO  benzonatate (TESSALON) 100 MG capsule Take 1 capsule (100 mg total) by mouth 3 (three) times daily as needed for cough. 05/11/22   Wallis Bamberg, PA-C  bisacodyl (DULCOLAX) 5 MG EC tablet Take 2 tablets (10 mg total) by mouth daily as needed for moderate constipation. 06/04/22   Wallis Bamberg, PA-C  cetirizine (ZYRTEC ALLERGY) 10 MG tablet Take 1 tablet (10 mg total) by mouth daily. 05/11/22   Wallis Bamberg, PA-C  docusate sodium (COLACE) 100 MG capsule Take 1 capsule (100 mg total) by mouth every 12 (twelve) hours. 06/04/22   Wallis Bamberg, PA-C  glycerin adult 2 g suppository Place 1 suppository rectally as needed for constipation. 06/25/21   Henderly, Britni A, PA-C  hydrocortisone cream 1 % Apply to affected area 2 times daily.  Use sparingly and no more than 5 days.  Do not get into the eye. 02/25/22   Radford Pax, NP  ondansetron (ZOFRAN-ODT) 4 MG disintegrating tablet Take 1 tablet (4 mg total) by mouth every 8 (eight) hours as needed for nausea or vomiting. 06/25/21   Henderly,  Britni A, PA-C  polyethylene glycol powder (MIRALAX) 17 GM/SCOOP powder Take 17 g by mouth daily as needed for severe constipation. 06/04/22   Wallis Bamberg, PA-C  promethazine-dextromethorphan (PROMETHAZINE-DM) 6.25-15 MG/5ML syrup Take 5 mLs by mouth 3 (three) times daily as needed for cough. 05/11/22   Wallis Bamberg, PA-C  pseudoephedrine (SUDAFED) 30 MG tablet Take 1 tablet (30 mg total) by mouth every 8 (eight) hours as needed for congestion. 05/11/22   Wallis Bamberg, PA-C  sodium phosphate (FLEET) 7-19 GM/118ML ENEM Place 133 mLs (1 enema total) rectally daily as needed for severe constipation. 06/04/22   Wallis Bamberg, PA-C      Allergies    Patient has no known allergies.    Review of Systems   Review of Systems  Physical Exam Updated Vital Signs BP (!) 148/102   Pulse 83   Temp 98.2 F (36.8 C)   Resp 16   SpO2 95%  Physical Exam Vitals and nursing note reviewed.  Constitutional:      General: She is not in acute distress.    Appearance: She is well-developed. She is not ill-appearing.  HENT:     Head: Normocephalic and atraumatic.     Nose: Nose normal.     Mouth/Throat:     Mouth: Mucous membranes are moist.  Eyes:     Extraocular Movements:  Extraocular movements intact.     Conjunctiva/sclera: Conjunctivae normal.     Pupils: Pupils are equal, round, and reactive to light.  Cardiovascular:     Rate and Rhythm: Normal rate and regular rhythm.     Pulses: Normal pulses.     Heart sounds: Normal heart sounds. No murmur heard. Pulmonary:     Effort: Pulmonary effort is normal. No respiratory distress.     Breath sounds: Normal breath sounds.  Abdominal:     Palpations: Abdomen is soft.     Tenderness: There is no abdominal tenderness.  Musculoskeletal:        General: No swelling.     Cervical back: Normal range of motion and neck supple.  Skin:    General: Skin is warm and dry.     Capillary Refill: Capillary refill takes less than 2 seconds.  Neurological:     General:  No focal deficit present.     Mental Status: She is alert.  Psychiatric:        Mood and Affect: Mood normal.     ED Results / Procedures / Treatments   Labs (all labs ordered are listed, but only abnormal results are displayed) Labs Reviewed  CBC WITH DIFFERENTIAL/PLATELET - Abnormal; Notable for the following components:      Result Value   Hemoglobin 11.4 (*)    HCT 34.8 (*)    All other components within normal limits  BASIC METABOLIC PANEL - Abnormal; Notable for the following components:   Glucose, Bld 100 (*)    All other components within normal limits  RESP PANEL BY RT-PCR (RSV, FLU A&B, COVID)  RVPGX2  LEAD, BLOOD (ADULT >= 16 YRS)    EKG None  Radiology DG Chest 2 View  Result Date: 12/03/2022 CLINICAL DATA:  Cough.  Diarrhea, vomiting, and fever. EXAM: CHEST - 2 VIEW COMPARISON:  None Available. FINDINGS: Shallow inspiration. Heart size and pulmonary vascularity are normal. Lungs are clear. No pleural effusions. No pneumothorax. Mediastinal contours appear intact. Visualized colon is prominent with gas and stool filling the colon. Similar appearance to prior study. IMPRESSION: No active cardiopulmonary disease. Large colonic stool burden again demonstrated. Electronically Signed   By: Burman Nieves M.D.   On: 12/03/2022 22:13    Procedures Procedures    Medications Ordered in ED Medications  dexamethasone (DECADRON) tablet 10 mg (10 mg Oral Given 12/03/22 2242)  azithromycin (ZITHROMAX) tablet 500 mg (500 mg Oral Given 12/03/22 2242)    ED Course/ Medical Decision Making/ A&P                                 Medical Decision Making Amount and/or Complexity of Data Reviewed Labs: ordered. Radiology: ordered.  Risk Prescription drug management.   Kayla Petersen is here with cough and congestion.  Sick contact with the same.  Normal vitals.  No fever.  Basic labs collected as well as chest x-ray.  Differential diagnosis bronchitis versus viral process.   Seems less likely to be pneumonia.  Lab work was unremarkable per my review and interpretation.  Chest x-ray with no evidence of pneumonia or pneumothorax.  I do think that this is likely a viral process/bronchitis process.  Will treat with Decadron and Z-Pak.  Patient also wanted to be tested for lead levels given some possible exposure at work.  She seems asymptomatic from this standpoint.  This blood work was sent and will have  her follow-up with her MyChart and primary care doctor.  Patient discharged in good condition.  Understands return precautions.  This chart was dictated using voice recognition software.  Despite best efforts to proofread,  errors can occur which can change the documentation meaning.         Final Clinical Impression(s) / ED Diagnoses Final diagnoses:  Upper respiratory tract infection, unspecified type    Rx / DC Orders ED Discharge Orders          Ordered    azithromycin (ZITHROMAX) 250 MG tablet  Daily        12/03/22 2248              Virgina Norfolk, DO 12/03/22 2257
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
## 2022-12-05 LAB — LEAD, BLOOD (ADULT >= 16 YRS): Lead-Whole Blood: <1 ug/dL (ref 0.0–3.4)

## 2023-07-11 ENCOUNTER — Encounter: Payer: Self-pay | Admitting: Nurse Practitioner

## 2024-01-21 ENCOUNTER — Encounter: Payer: Self-pay | Admitting: Dermatology

## 2024-01-21 ENCOUNTER — Ambulatory Visit: Admitting: Dermatology

## 2024-01-21 DIAGNOSIS — L219 Seborrheic dermatitis, unspecified: Secondary | ICD-10-CM

## 2024-01-21 DIAGNOSIS — L700 Acne vulgaris: Secondary | ICD-10-CM

## 2024-01-21 MED ORDER — CLINDAMYCIN PHOSPHATE 1 % EX SWAB: 1.0000 | Freq: Every day | CUTANEOUS | 6 refills | Status: AC

## 2024-01-21 MED ORDER — KETOCONAZOLE 2 % EX CREA: 1.0000 | TOPICAL_CREAM | Freq: Two times a day (BID) | CUTANEOUS | 4 refills | Status: AC

## 2024-01-21 MED ORDER — TRETINOIN 0.025 % EX CREA: TOPICAL_CREAM | Freq: Every day | CUTANEOUS | 6 refills | Status: AC

## 2024-01-21 MED ORDER — SPIRONOLACTONE 100 MG PO TABS: 100.0000 mg | ORAL_TABLET | Freq: Every day | ORAL | 3 refills | Status: AC

## 2024-01-21 NOTE — Patient Instructions (Addendum)
 " VISIT SUMMARY:  Today, you were seen for the evaluation and management of your adult female acne and seborrheic dermatitis. We discussed your acne, which is hormonally driven and primarily affects your lower jawline, and your eyebrow dermatitis, which flares up in cold weather.  YOUR PLAN:  -ACNE VULGARIS:  Acne vulgaris is a common skin condition that occurs when hair follicles become clogged with oil and dead skin cells, leading to pimples, blackheads, and cysts. Your acne is hormonally driven and flares up around your menstrual cycle.   We have prescribed spironolactone  100 mg to be taken every evening to help block hormone receptors and prevent oil gland activation.   You should use a gentle cleanser like La Roche-Posay or CeraVe in the morning, followed by clindamycin  swabs after cleansing and before moisturizing.   We have reintroduced tretinoin  0.25%, starting with two nights a week, increasing to three nights a week in March, and potentially five nights a week by May.   Use non-comedogenic moisturizers such as CeraVe, Cetaphil, or Aveeno. We provided samples of different cleansers and moisturizers for you to try.  -SEBORRHEIC DERMATITIS:  Seborrheic dermatitis is a skin condition that causes greasy, flaky skin due to yeast overgrowth and is often exacerbated by cold weather.   For your eyebrow dermatitis, we have prescribed ketoconazole  cream to be used during flares, applying daily until the condition resolves. Use a gentle cleanser to manage oil levels. We have provided refills for the ketoconazole  cream.  INSTRUCTIONS:  Please follow up in May to assess your progress with the acne treatment plan.   Important Information  Due to recent changes in healthcare laws, you may see results of your pathology and/or laboratory studies on MyChart before the doctors have had a chance to review them. We understand that in some cases there may be results that are confusing or concerning  to you. Please understand that not all results are received at the same time and often the doctors may need to interpret multiple results in order to provide you with the best plan of care or course of treatment. Therefore, we ask that you please give us  2 business days to thoroughly review all your results before contacting the office for clarification. Should we see a critical lab result, you will be contacted sooner.   If You Need Anything After Your Visit  If you have any questions or concerns for your doctor, please call our main line at [phone] If no one answers, please leave a voicemail as directed and we will return your call as soon as possible. Messages left after 4 pm will be answered the following business day.   You may also send us  a message via MyChart. We typically respond to MyChart messages within 1-2 business days.  For prescription refills, please ask your pharmacy to contact our office. Our fax number is [phone].  If you have an urgent issue when the clinic is closed that cannot wait until the next business day, you can page your doctor at the number below.    Please note that while we do our best to be available for urgent issues outside of office hours, we are not available 24/7.   If you have an urgent issue and are unable to reach us , you may choose to seek medical care at your doctor's office, retail clinic, urgent care center, or emergency room.  If you have a medical emergency, please immediately call 911 or go to the emergency department. In the event  of inclement weather, please call our main line at [phone] for an update on the status of any delays or closures.  Dermatology Medication Tips: Please keep the boxes that topical medications come in in order to help keep track of the instructions about where and how to use these. Pharmacies typically print the medication instructions only on the boxes and not directly on the medication tubes.   If your  medication is too expensive, please contact our office at [phone] or send us  a message through MyChart.   We are unable to tell what your co-pay for medications will be in advance as this is different depending on your insurance coverage. However, we may be able to find a substitute medication at lower cost or fill out paperwork to get insurance to cover a needed medication.   If a prior authorization is required to get your medication covered by your insurance company, please allow us  1-2 business days to complete this process.  Drug prices often vary depending on where the prescription is filled and some pharmacies may offer cheaper prices.  The website www.goodrx.com contains coupons for medications through different pharmacies. The prices here do not account for what the cost may be with help from insurance (it may be cheaper with your insurance), but the website can give you the price if you did not use any insurance.  - You can print the associated coupon and take it with your prescription to the pharmacy.  - You may also stop by our office during regular business hours and pick up a GoodRx coupon card.  - If you need your prescription sent electronically to a different pharmacy, notify our office through [facility] or by phone at [phone]     "

## 2024-01-21 NOTE — Progress Notes (Signed)
 "  New Patient Visit  Patient (and/or pt guardian) consented to the use of AI-assisted tools for note generation.    Subjective  Kayla Petersen is a 39 y.o. female who presents for the following: Acne and dermatitis - patient is accompanied by mom  Acne Located at the that she would like to have examined. Patient reports the areas have been there several years She reports the areas are bothersome. Patient rates irritation 4 out of 10.  She states that the areas have spread.  Patient reports she has previously been treated for these areas And was using tretinoin  for a moment but does not recall for how long or what strength Patient reports she has tried salicylic acid washes, pimple patches, and other washes and creams for acne Patient reports she uses water to wash her face and Vaseline as a moisturizer   Dermatitis Patient reports dry areas on face and flakiness on eyelashes and eyebrows that hs been there for just over a year Patient states that she sometimes will scratch the flakes off and the skin underneath will be red and irritated Patient reports she has not been treated for these areas and has not used any OTC treatments for dry skin  The following portions of the chart were reviewed this encounter and updated as appropriate: medications, allergies, medical history  Review of Systems:  No other skin or systemic complaints except as noted in HPI or Assessment and Plan.  Objective  Well appearing patient in no apparent distress; mood and affect are within normal limits.  A focused examination was performed of the following areas: face  Relevant exam findings are noted in the Assessment and Plan.         Assessment & Plan   Acne vulgaris Adult female acne, hormonally driven, predominantly affecting the lower jawline. Flares correlate with menstrual cycle. Current regimen includes tretinoin , but no recent use of topical treatments. Discussed spironolactone  as an oral  medication to block hormone receptors and prevent oil gland activation. Explained that spironolactone  is not an antibiotic and does not affect blood pressure at acne treatment doses. Discussed potential side effects such as lightheadedness and dizziness, though rare. Emphasized the importance of a gentle skincare routine to avoid irritation. Tretinoin  to be reintroduced with a gradual increase in frequency to minimize side effects. Clindamycin  swabs to reduce bacterial count. Moisturizers to be non-comedogenic.  - Prescribed spironolactone  100 mg every evening. - Recommended using a gentle cleanser such as La Roche-Posay or CeraVe in the morning. - Prescribed clindamycin  swabs for use after cleansing and before moisturizing. - Reintroduced tretinoin , starting with two nights a week, increasing to three nights a week in March, and potentially five nights a week by May. - Advised using non-comedogenic moisturizers such as CeraVe, Cetaphil, or Aveeno. - Provided samples of different cleansers and moisturizers. - Scheduled follow-up appointment in May to assess progress.  Seborrheic dermatitis Intermittent seborrheic dermatitis affecting the eyebrows, exacerbated by cold weather. Characterized by greasy, flaky skin due to yeast overgrowth. Condition is weather-dependent and can resolve in a few days to a couple of weeks.  - Prescribed ketoconazole  cream for use during flares, applying daily until resolution. - Advised using a gentle cleanser to manage oil levels. - Provided refills for ketoconazole  cream.   No follow-ups on file.  LILLETTE Lyle Cords, am acting as a neurosurgeon for [employer], DO .   Documentation: I have reviewed the above documentation for accuracy and completeness, and I agree with the  above.  Delon Lenis, DO     "

## 2024-10-20 ENCOUNTER — Ambulatory Visit: Admitting: Dermatology
